# Patient Record
Sex: Female | Born: 1960 | Race: Black or African American | Hispanic: No | Marital: Single
Health system: Southern US, Community
[De-identification: ages and names within clinical notes are randomized; demographics above are authoritative.]

## PROBLEM LIST (undated history)

## (undated) DIAGNOSIS — I10 Essential (primary) hypertension: Secondary | ICD-10-CM

## (undated) HISTORY — DX: Essential (primary) hypertension: I10

---

## 2021-03-26 ENCOUNTER — Emergency Department: Payer: BC Managed Care – PPO

## 2021-03-26 ENCOUNTER — Encounter: Payer: Self-pay | Admitting: Emergency Medicine

## 2021-03-26 ENCOUNTER — Observation Stay
Admission: EM | Admit: 2021-03-26 | Discharge: 2021-03-27 | Disposition: A | Discharge status: Home / Self Care | Payer: BC Managed Care – PPO | Attending: Family Medicine | Admitting: Family Medicine

## 2021-03-26 ENCOUNTER — Other Ambulatory Visit: Payer: Self-pay

## 2021-03-26 DIAGNOSIS — S20219A Contusion of unspecified front wall of thorax, initial encounter: Principal | ICD-10-CM | POA: Insufficient documentation

## 2021-03-26 DIAGNOSIS — Z79899 Other long term (current) drug therapy: Secondary | ICD-10-CM | POA: Diagnosis not present

## 2021-03-26 DIAGNOSIS — R0789 Other chest pain: Secondary | ICD-10-CM | POA: Insufficient documentation

## 2021-03-26 DIAGNOSIS — R079 Chest pain, unspecified: Secondary | ICD-10-CM | POA: Diagnosis not present

## 2021-03-26 DIAGNOSIS — S60811A Abrasion of right wrist, initial encounter: Secondary | ICD-10-CM | POA: Insufficient documentation

## 2021-03-26 DIAGNOSIS — S50812A Abrasion of left forearm, initial encounter: Secondary | ICD-10-CM | POA: Diagnosis not present

## 2021-03-26 DIAGNOSIS — E876 Hypokalemia: Secondary | ICD-10-CM | POA: Insufficient documentation

## 2021-03-26 DIAGNOSIS — Y92410 Unspecified street and highway as the place of occurrence of the external cause: Secondary | ICD-10-CM | POA: Diagnosis not present

## 2021-03-26 DIAGNOSIS — R778 Other specified abnormalities of plasma proteins: Secondary | ICD-10-CM | POA: Diagnosis not present

## 2021-03-26 DIAGNOSIS — Z20822 Contact with and (suspected) exposure to covid-19: Secondary | ICD-10-CM | POA: Diagnosis not present

## 2021-03-26 DIAGNOSIS — I10 Essential (primary) hypertension: Secondary | ICD-10-CM | POA: Diagnosis not present

## 2021-03-26 DIAGNOSIS — R109 Unspecified abdominal pain: Secondary | ICD-10-CM | POA: Insufficient documentation

## 2021-03-26 DIAGNOSIS — S299XXA Unspecified injury of thorax, initial encounter: Secondary | ICD-10-CM | POA: Diagnosis present

## 2021-03-26 HISTORY — DX: Essential (primary) hypertension: I10

## 2021-03-26 LAB — URINALYSIS, COMPLETE (UACMP) WITH MICROSCOPIC
Bacteria, UA: NONE SEEN
Bilirubin Urine: NEGATIVE
Glucose, UA: NEGATIVE mg/dL
Hgb urine dipstick: NEGATIVE
Ketones, ur: NEGATIVE mg/dL
Nitrite: NEGATIVE
Protein, ur: 100 mg/dL — AB
Specific Gravity, Urine: 1.029 (ref 1.005–1.030)
pH: 5 (ref 5.0–8.0)

## 2021-03-26 LAB — COMPREHENSIVE METABOLIC PANEL
ALT: 24 U/L (ref 0–44)
AST: 27 U/L (ref 15–41)
Albumin: 4.1 g/dL (ref 3.5–5.0)
Alkaline Phosphatase: 99 U/L (ref 38–126)
Anion gap: 5 (ref 5–15)
BUN: 17 mg/dL (ref 6–20)
CO2: 28 mmol/L (ref 22–32)
Calcium: 9.2 mg/dL (ref 8.9–10.3)
Chloride: 104 mmol/L (ref 98–111)
Creatinine, Ser: 0.97 mg/dL (ref 0.44–1.00)
GFR, Estimated: >60 mL/min (ref >60)
Glucose, Bld: 108 mg/dL — ABNORMAL HIGH (ref 70–99)
Potassium: 3.1 mmol/L — ABNORMAL LOW (ref 3.5–5.1)
Sodium: 137 mmol/L (ref 135–145)
Total Bilirubin: 0.7 mg/dL (ref 0.3–1.2)
Total Protein: 8.5 g/dL — ABNORMAL HIGH (ref 6.5–8.1)

## 2021-03-26 LAB — CBC WITH DIFFERENTIAL/PLATELET
Abs Immature Granulocytes: 0.02 10*3/uL (ref 0.00–0.07)
Basophils Absolute: 0 10*3/uL (ref 0.0–0.1)
Basophils Relative: 0 %
Eosinophils Absolute: 0.1 10*3/uL (ref 0.0–0.5)
Eosinophils Relative: 1 %
HCT: 37.1 % (ref 36.0–46.0)
Hemoglobin: 12.7 g/dL (ref 12.0–15.0)
Immature Granulocytes: 0 %
Lymphocytes Relative: 24 %
Lymphs Abs: 1.4 10*3/uL (ref 0.7–4.0)
MCH: 30.9 pg (ref 26.0–34.0)
MCHC: 34.2 g/dL (ref 30.0–36.0)
MCV: 90.3 fL (ref 80.0–100.0)
Monocytes Absolute: 0.5 10*3/uL (ref 0.1–1.0)
Monocytes Relative: 9 %
Neutro Abs: 3.7 10*3/uL (ref 1.7–7.7)
Neutrophils Relative %: 66 %
Platelets: 288 10*3/uL (ref 150–400)
RBC: 4.11 MIL/uL (ref 3.87–5.11)
RDW: 13.2 % (ref 11.5–15.5)
WBC: 5.7 10*3/uL (ref 4.0–10.5)
nRBC: 0 % (ref 0.0–0.2)

## 2021-03-26 LAB — TYPE AND SCREEN
ABO/RH(D): B POS
Antibody Screen: NEGATIVE

## 2021-03-26 LAB — TROPONIN I (HIGH SENSITIVITY)
Troponin I (High Sensitivity): 97 ng/L — ABNORMAL HIGH (ref <18)
Troponin I (High Sensitivity): 86 ng/L — ABNORMAL HIGH (ref <18)

## 2021-03-26 MED ORDER — IOHEXOL 300 MG/ML  SOLN
125.0000 mL | Freq: Once | INTRAMUSCULAR | Status: AC | PRN
  Administered 2021-03-26: 125 mL via INTRAVENOUS
  Filled 2021-03-26: qty 125

## 2021-03-26 MED ORDER — LOSARTAN POTASSIUM-HCTZ 100-12.5 MG PO TABS: 1.0000 | ORAL_TABLET | Freq: Every day | ORAL | Status: DC

## 2021-03-26 MED ORDER — MAGNESIUM HYDROXIDE 400 MG/5ML PO SUSP: 30.0000 mL | Freq: Every day | ORAL | Status: DC | PRN

## 2021-03-26 MED ORDER — CALCIUM CARBONATE-VITAMIN D 500-200 MG-UNIT PO TABS
1.0000 | ORAL_TABLET | Freq: Every day | ORAL | Status: DC
  Administered 2021-03-27: 1 via ORAL
  Filled 2021-03-26: qty 1

## 2021-03-26 MED ORDER — ACETAMINOPHEN 650 MG RE SUPP: 650.0000 mg | Freq: Four times a day (QID) | RECTAL | Status: DC | PRN

## 2021-03-26 MED ORDER — OXYCODONE-ACETAMINOPHEN 5-325 MG PO TABS
1.0000 | ORAL_TABLET | Freq: Once | ORAL | Status: AC
  Administered 2021-03-26: 1 via ORAL
  Filled 2021-03-26: qty 1

## 2021-03-26 MED ORDER — ONDANSETRON HCL 4 MG/2ML IJ SOLN: 4.0000 mg | Freq: Four times a day (QID) | INTRAMUSCULAR | Status: DC | PRN

## 2021-03-26 MED ORDER — ENOXAPARIN SODIUM 60 MG/0.6ML IJ SOSY
0.5000 mg/kg | PREFILLED_SYRINGE | INTRAMUSCULAR | Status: DC
  Administered 2021-03-27: 55 mg via SUBCUTANEOUS
  Filled 2021-03-26: qty 0.6

## 2021-03-26 MED ORDER — ASPIRIN EC 81 MG PO TBEC
81.0000 mg | DELAYED_RELEASE_TABLET | Freq: Every day | ORAL | Status: DC
  Administered 2021-03-27: 81 mg via ORAL
  Filled 2021-03-26: qty 1

## 2021-03-26 MED ORDER — AMLODIPINE BESYLATE 5 MG PO TABS
5.0000 mg | ORAL_TABLET | Freq: Every day | ORAL | Status: DC
  Administered 2021-03-27: 5 mg via ORAL
  Filled 2021-03-26: qty 1

## 2021-03-26 MED ORDER — HYDROCHLOROTHIAZIDE 12.5 MG PO CAPS
12.5000 mg | ORAL_CAPSULE | Freq: Every day | ORAL | Status: DC
  Administered 2021-03-27: 12.5 mg via ORAL
  Filled 2021-03-26: qty 1

## 2021-03-26 MED ORDER — ADULT MULTIVITAMIN W/MINERALS CH
ORAL_TABLET | Freq: Every day | ORAL | Status: DC
  Administered 2021-03-27: 1 via ORAL
  Filled 2021-03-26: qty 1

## 2021-03-26 MED ORDER — ELDERBERRY 500 MG PO CAPS: 1.0000 | ORAL_CAPSULE | Freq: Every day | ORAL | Status: DC

## 2021-03-26 MED ORDER — ACETAMINOPHEN 325 MG PO TABS
650.0000 mg | ORAL_TABLET | Freq: Four times a day (QID) | ORAL | Status: DC | PRN
  Administered 2021-03-27: 650 mg via ORAL
  Filled 2021-03-26: qty 2

## 2021-03-26 MED ORDER — NITROGLYCERIN 0.4 MG SL SUBL: 0.4000 mg | SUBLINGUAL_TABLET | SUBLINGUAL | Status: DC | PRN

## 2021-03-26 MED ORDER — TRAZODONE HCL 50 MG PO TABS
25.0000 mg | ORAL_TABLET | Freq: Every evening | ORAL | Status: DC | PRN
  Administered 2021-03-27: 25 mg via ORAL
  Filled 2021-03-26: qty 1

## 2021-03-26 MED ORDER — ONDANSETRON HCL 4 MG PO TABS: 4.0000 mg | ORAL_TABLET | Freq: Four times a day (QID) | ORAL | Status: DC | PRN

## 2021-03-26 MED ORDER — POTASSIUM CHLORIDE CRYS ER 20 MEQ PO TBCR
40.0000 meq | EXTENDED_RELEASE_TABLET | Freq: Once | ORAL | Status: AC
  Administered 2021-03-26: 40 meq via ORAL
  Filled 2021-03-26: qty 2

## 2021-03-26 MED ORDER — POTASSIUM CHLORIDE 20 MEQ PO PACK
40.0000 meq | PACK | Freq: Once | ORAL | Status: AC
  Administered 2021-03-27: 40 meq via ORAL
  Filled 2021-03-26: qty 2

## 2021-03-26 MED ORDER — LOSARTAN POTASSIUM 50 MG PO TABS
100.0000 mg | ORAL_TABLET | Freq: Every day | ORAL | Status: DC
  Administered 2021-03-27: 100 mg via ORAL
  Filled 2021-03-26: qty 2

## 2021-03-26 MED ORDER — LORATADINE 10 MG PO TABS: 10.0000 mg | ORAL_TABLET | Freq: Every day | ORAL | Status: DC | PRN

## 2021-03-26 MED ORDER — MORPHINE SULFATE (PF) 2 MG/ML IV SOLN: 2.0000 mg | INTRAVENOUS | Status: DC | PRN

## 2021-03-26 MED ORDER — SODIUM CHLORIDE 0.9 % IV SOLN: INTRAVENOUS | Status: DC

## 2021-03-26 NOTE — Progress Notes (Signed)
PHARMACIST - PHYSICIAN COMMUNICATION  CONCERNING:  Enoxaparin (Lovenox) for DVT Prophylaxis    RECOMMENDATION: Patient was prescribed enoxaprin 40mg  q24 hours for VTE prophylaxis.   Filed Weights   03/26/21 1712  Weight: 108.9 kg (240 lb)    Body mass index is 43.9 kg/m.  Estimated Creatinine Clearance: 72.6 mL/min (by C-G formula based on SCr of 0.97 mg/dL).   Based on [facility] policy patient is candidate for enoxaparin 0.5mg /kg TBW SQ every 24 hours based on BMI being >30.  DESCRIPTION: Pharmacy has adjusted enoxaparin dose per [facility] policy.  Patient is now receiving enoxaparin 0.5 mg/kg every 24 hours   [facility], PharmD, [facility] 03/26/2021 11:53 PM
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
## 2021-03-26 NOTE — Progress Notes (Signed)
PHARMACIST - PHYSICIAN ORDER COMMUNICATION  CONCERNING: P&T Medication Policy on Herbal Medications  DESCRIPTION:  This patient's order for:  Elderberry CAPS  has been noted.  This product(s) is classified as an "herbal" or natural product. Due to a lack of definitive safety studies or FDA approval, nonstandard manufacturing practices, plus the potential risk of unknown drug-drug interactions while on inpatient medications, the Pharmacy and Therapeutics Committee does not permit the use of "herbal" or natural products of this type within [facility].   ACTION TAKEN: The pharmacy department is unable to verify this order at this time.  Please reevaluate patient's clinical condition at discharge and address if the herbal or natural product(s) should be resumed at that time.   Otelia Sergeant, PharmD, [facility] 03/26/2021 11:46 PM
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
## 2021-03-27 DIAGNOSIS — R079 Chest pain, unspecified: Secondary | ICD-10-CM | POA: Diagnosis not present

## 2021-03-27 DIAGNOSIS — I10 Essential (primary) hypertension: Secondary | ICD-10-CM | POA: Insufficient documentation

## 2021-03-27 LAB — TROPONIN I (HIGH SENSITIVITY): Troponin I (High Sensitivity): 85 ng/L — ABNORMAL HIGH (ref <18)

## 2021-03-27 LAB — CK: Total CK: 326 U/L — ABNORMAL HIGH (ref 38–234)

## 2021-03-27 LAB — HIV ANTIBODY (ROUTINE TESTING W REFLEX): HIV Screen 4th Generation wRfx: NONREACTIVE

## 2021-03-27 LAB — SARS CORONAVIRUS 2 (TAT 6-24 HRS): SARS Coronavirus 2: NEGATIVE

## 2021-03-27 MED ORDER — OXYCODONE-ACETAMINOPHEN 5-325 MG PO TABS
1.0000 | ORAL_TABLET | Freq: Four times a day (QID) | ORAL | Status: DC | PRN
Start: 2021-03-27 — End: 2021-03-27
  Administered 2021-03-27: 1 via ORAL
  Filled 2021-03-27: qty 1

## 2021-03-27 MED ORDER — MORPHINE SULFATE (PF) 2 MG/ML IV SOLN: 2.0000 mg | INTRAVENOUS | Status: DC | PRN

## 2021-03-27 MED ORDER — ASPIRIN 81 MG PO TBEC: 81.0000 mg | DELAYED_RELEASE_TABLET | Freq: Every day | ORAL | 11 refills | Status: AC

## 2021-03-27 NOTE — ED Notes (Signed)
Secure chat sent to Dr Allena Katz to inform her that pt ambulated well- Allena Katz states she will possibly d/c when she comes to see pt

## 2021-03-27 NOTE — ED Notes (Signed)
Pt ambulatory to RR unassisted at this time.

## 2021-03-27 NOTE — ED Notes (Signed)
Lab to add on CK at this time.

## 2021-03-27 NOTE — Discharge Instructions (Signed)
F/u with your PCP in [place]
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
## 2021-03-27 NOTE — ED Notes (Signed)
Pillow placed under pt right leg for comfort at this time.
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
